# Patient Record
Sex: Female | Born: 1990 | Race: Black or African American | Hispanic: No | Marital: Single | Smoking: Current every day smoker
Health system: Southern US, Community
[De-identification: ages and names within clinical notes are randomized; demographics above are authoritative.]

## PROBLEM LIST (undated history)

## (undated) DIAGNOSIS — J45909 Unspecified asthma, uncomplicated: Secondary | ICD-10-CM

---
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
## 2013-08-24 ENCOUNTER — Emergency Department (HOSPITAL_COMMUNITY)
Admission: EM | Admit: 2013-08-24 | Discharge: 2013-08-25 | Disposition: A | Discharge status: Home / Self Care | Payer: 59 | Attending: Emergency Medicine | Admitting: Emergency Medicine

## 2013-08-24 DIAGNOSIS — T23269A Burn of second degree of back of unspecified hand, initial encounter: Secondary | ICD-10-CM | POA: Insufficient documentation

## 2013-08-24 DIAGNOSIS — X12XXXA Contact with other hot fluids, initial encounter: Secondary | ICD-10-CM | POA: Insufficient documentation

## 2013-08-24 DIAGNOSIS — Y93G9 Activity, other involving cooking and grilling: Secondary | ICD-10-CM | POA: Insufficient documentation

## 2013-08-24 DIAGNOSIS — T23262A Burn of second degree of back of left hand, initial encounter: Secondary | ICD-10-CM

## 2013-08-24 DIAGNOSIS — X131XXA Other contact with steam and other hot vapors, initial encounter: Secondary | ICD-10-CM

## 2013-08-24 DIAGNOSIS — Y9289 Other specified places as the place of occurrence of the external cause: Secondary | ICD-10-CM | POA: Insufficient documentation

## 2013-08-24 MED ORDER — SILVER SULFADIAZINE 1 % EX CREA
TOPICAL_CREAM | Freq: Once | CUTANEOUS | Status: AC
  Administered 2013-08-24: via TOPICAL
  Filled 2013-08-24: qty 85

## 2013-08-24 MED ORDER — OXYCODONE-ACETAMINOPHEN 5-325 MG PO TABS
2.0000 | ORAL_TABLET | Freq: Once | ORAL | Status: AC
  Administered 2013-08-24: 2 via ORAL
  Filled 2013-08-24: qty 2
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
## 2013-08-25 MED ORDER — SILVER SULFADIAZINE 1 % EX CREA: 1.0000 "application " | TOPICAL_CREAM | Freq: Every day | CUTANEOUS | Status: AC

## 2013-08-25 MED ORDER — OXYCODONE-ACETAMINOPHEN 5-325 MG PO TABS: 1.0000 | ORAL_TABLET | ORAL | Status: AC | PRN

## 2013-08-25 NOTE — Discharge Instructions (Signed)
Burn Care Your skin is a natural barrier to infection. It is the largest organ of your body. Burns damage this natural protection. To help prevent infection, it is very important to follow your caregiver's instructions in the care of your burn. Burns are classified as:  First degree. There is only redness of the skin (erythema). No scarring is expected.  Second degree. There is blistering of the skin. Scarring may occur with deeper burns.  Third degree. All layers of the skin are injured, and scarring is expected. HOME CARE INSTRUCTIONS   Wash your hands well before changing your bandage.  Change your bandage as often as directed by your caregiver.  Remove the old bandage. If the bandage sticks, you may soak it off with cool, clean water.  Cleanse the burn thoroughly but gently with mild soap and water.  Pat the area dry with a clean, dry cloth.  Apply a thin layer of antibacterial cream to the burn.  Apply a clean bandage as instructed by your caregiver.  Keep the bandage as clean and dry as possible.  Elevate the affected area for the first 24 hours, then as instructed by your caregiver.  Only take over-the-counter or prescription medicines for pain, discomfort, or fever as directed by your caregiver. SEEK IMMEDIATE MEDICAL CARE IF:   You develop excessive pain.  You develop redness, tenderness, swelling, or red streaks near the burn.  The burned area develops yellowish-white fluid (pus) or a bad smell.  You have a fever. MAKE SURE YOU:   Understand these instructions.  Will watch your condition.  Will get help right away if you are not doing well or get worse. Document Released: 01/22/2005 Document Revised: 04/16/2011 Document Reviewed: 06/14/2010 [employer] Patient Information 2015 [employer]. This information is not intended to replace advice given to you by your health care provider. Make sure you discuss any questions you have with your health care  provider.  
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
## 2014-04-23 ENCOUNTER — Encounter (HOSPITAL_COMMUNITY): Payer: Self-pay | Admitting: Emergency Medicine

## 2014-04-23 ENCOUNTER — Emergency Department (HOSPITAL_COMMUNITY)
Admission: EM | Admit: 2014-04-23 | Discharge: 2014-04-23 | Disposition: A | Discharge status: Home / Self Care | Payer: No Typology Code available for payment source | Attending: Emergency Medicine | Admitting: Emergency Medicine

## 2014-04-23 ENCOUNTER — Emergency Department (HOSPITAL_COMMUNITY): Payer: No Typology Code available for payment source

## 2014-04-23 DIAGNOSIS — Z79899 Other long term (current) drug therapy: Secondary | ICD-10-CM | POA: Diagnosis not present

## 2014-04-23 DIAGNOSIS — J45909 Unspecified asthma, uncomplicated: Secondary | ICD-10-CM | POA: Diagnosis not present

## 2014-04-23 DIAGNOSIS — Y9389 Activity, other specified: Secondary | ICD-10-CM | POA: Diagnosis not present

## 2014-04-23 DIAGNOSIS — S8002XA Contusion of left knee, initial encounter: Secondary | ICD-10-CM | POA: Insufficient documentation

## 2014-04-23 DIAGNOSIS — S6392XA Sprain of unspecified part of left wrist and hand, initial encounter: Secondary | ICD-10-CM | POA: Diagnosis not present

## 2014-04-23 DIAGNOSIS — Y92410 Unspecified street and highway as the place of occurrence of the external cause: Secondary | ICD-10-CM | POA: Insufficient documentation

## 2014-04-23 DIAGNOSIS — S8992XA Unspecified injury of left lower leg, initial encounter: Secondary | ICD-10-CM | POA: Diagnosis present

## 2014-04-23 DIAGNOSIS — Y998 Other external cause status: Secondary | ICD-10-CM | POA: Diagnosis not present

## 2014-04-23 HISTORY — DX: Unspecified asthma, uncomplicated: J45.909
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
## 2014-04-23 MED ORDER — CYCLOBENZAPRINE HCL 10 MG PO TABS: 10.0000 mg | ORAL_TABLET | Freq: Two times a day (BID) | ORAL | Status: AC | PRN

## 2014-04-23 MED ORDER — IBUPROFEN 800 MG PO TABS: 800.0000 mg | ORAL_TABLET | Freq: Three times a day (TID) | ORAL | Status: AC

## 2014-04-23 MED ORDER — HYDROCODONE-ACETAMINOPHEN 5-325 MG PO TABS: 1.0000 | ORAL_TABLET | Freq: Once | ORAL | Status: DC

## 2014-04-23 MED ORDER — HYDROCODONE-ACETAMINOPHEN 5-325 MG PO TABS: 1.0000 | ORAL_TABLET | ORAL | Status: AC | PRN

## 2014-04-23 MED ORDER — IBUPROFEN 800 MG PO TABS
800.0000 mg | ORAL_TABLET | Freq: Once | ORAL | Status: AC
  Administered 2014-04-23: 800 mg via ORAL
  Filled 2014-04-23: qty 1
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
## 2014-04-23 NOTE — Discharge Instructions (Signed)
Cryotherapy °Cryotherapy means treatment with cold. Ice or gel packs can be used to reduce both pain and swelling. Ice is the most helpful within the first 24 to 48 hours after an injury or flare-up from overusing a muscle or joint. Sprains, strains, spasms, burning pain, shooting pain, and aches can all be eased with ice. Ice can also be used when recovering from surgery. Ice is effective, has very few side effects, and is safe for most people to use. °PRECAUTIONS  °Ice is not a safe treatment option for people with: °· Raynaud phenomenon. This is a condition affecting small blood vessels in the extremities. Exposure to cold may cause your problems to return. °· Cold hypersensitivity. There are many forms of cold hypersensitivity, including: °· Cold urticaria. Red, itchy hives appear on the skin when the tissues begin to warm after being iced. °· Cold erythema. This is a red, itchy rash caused by exposure to cold. °· Cold hemoglobinuria. Red blood cells break down when the tissues begin to warm after being iced. The hemoglobin that carry oxygen are passed into the urine because they cannot combine with blood proteins fast enough. °· Numbness or altered sensitivity in the area being iced. °If you have any of the following conditions, do not use ice until you have discussed cryotherapy with your caregiver: °· Heart conditions, such as arrhythmia, angina, or chronic heart disease. °· High blood pressure. °· Healing wounds or open skin in the area being iced. °· Current infections. °· Rheumatoid arthritis. °· Poor circulation. °· Diabetes. °Ice slows the blood flow in the region it is applied. This is beneficial when trying to stop inflamed tissues from spreading irritating chemicals to surrounding tissues. However, if you expose your skin to cold temperatures for too long or without the proper protection, you can damage your skin or nerves. Watch for signs of skin damage due to cold. °HOME CARE INSTRUCTIONS °Follow  these tips to use ice and cold packs safely. °· Place a dry or damp towel between the ice and skin. A damp towel will cool the skin more quickly, so you may need to shorten the time that the ice is used. °· For a more rapid response, add gentle compression to the ice. °· Ice for no more than 10 to 20 minutes at a time. The bonier the area you are icing, the less time it will take to get the benefits of ice. °· Check your skin after 5 minutes to make sure there are no signs of a poor response to cold or skin damage. °· Rest 20 minutes or more between uses. °· Once your skin is numb, you can end your treatment. You can test numbness by very lightly touching your skin. The touch should be so light that you do not see the skin dimple from the pressure of your fingertip. When using ice, most people will feel these normal sensations in this order: cold, burning, aching, and numbness. °· Do not use ice on someone who cannot communicate their responses to pain, such as small children or people with dementia. °HOW TO MAKE AN ICE PACK °Ice packs are the most common way to use ice therapy. Other methods include ice massage, ice baths, and cryosprays. Muscle creams that cause a cold, tingly feeling do not offer the same benefits that ice offers and should not be used as a substitute unless recommended by your caregiver. °To make an ice pack, do one of the following: °· Place crushed ice or a   bag of frozen vegetables in a sealable plastic bag. Squeeze out the excess air. Place this bag inside another plastic bag. Slide the bag into a pillowcase or place a damp towel between your skin and the bag.  Mix 3 parts water with 1 part rubbing alcohol. Freeze the mixture in a sealable plastic bag. When you remove the mixture from the freezer, it will be slushy. Squeeze out the excess air. Place this bag inside another plastic bag. Slide the bag into a pillowcase or place a damp towel between your skin and the bag. SEEK MEDICAL CARE  IF:  You develop white spots on your skin. This may give the skin a blotchy (mottled) appearance.  Your skin turns blue or pale.  Your skin becomes waxy or hard.  Your swelling gets worse. MAKE SURE YOU:   Understand these instructions.  Will watch your condition.  Will get help right away if you are not doing well or get worse. Document Released: 09/18/2010 Document Revised: 06/08/2013 Document Reviewed: 09/18/2010 [facility] Patient Information 2015 [place], [place]. This information is not intended to replace advice given to you by your health care provider. Make sure you discuss any questions you have with your health care provider.  Contusion A contusion is a deep bruise. Contusions are the result of an injury that caused bleeding under the skin. The contusion may turn blue, purple, or yellow. Minor injuries will give you a painless contusion, but more severe contusions may stay painful and swollen for a few weeks.  CAUSES  A contusion is usually caused by a blow, trauma, or direct force to an area of the body. SYMPTOMS   Swelling and redness of the injured area.  Bruising of the injured area.  Tenderness and soreness of the injured area.  Pain. DIAGNOSIS  The diagnosis can be made by taking a history and physical exam. An X-ray, CT scan, or MRI may be needed to determine if there were any associated injuries, such as fractures. TREATMENT  Specific treatment will depend on what area of the body was injured. In general, the best treatment for a contusion is resting, icing, elevating, and applying cold compresses to the injured area. Over-the-counter medicines may also be recommended for pain control. Ask your caregiver what the best treatment is for your contusion. HOME CARE INSTRUCTIONS   Put ice on the injured area.  Put ice in a plastic bag.  Place a towel between your skin and the bag.  Leave the ice on for 15-20 minutes, 3-4 times a day, or as directed by your health  care provider.  Only take over-the-counter or prescription medicines for pain, discomfort, or fever as directed by your caregiver. Your caregiver may recommend avoiding anti-inflammatory medicines (aspirin, ibuprofen, and naproxen) for 48 hours because these medicines may increase bruising.  Rest the injured area.  If possible, elevate the injured area to reduce swelling. SEEK IMMEDIATE MEDICAL CARE IF:   You have increased bruising or swelling.  You have pain that is getting worse.  Your swelling or pain is not relieved with medicines. MAKE SURE YOU:   Understand these instructions.  Will watch your condition.  Will get help right away if you are not doing well or get worse. Document Released: 11/01/2004 Document Revised: 01/27/2013 Document Reviewed: 11/27/2010 [facility] Patient Information 2015 [place], [place]. This information is not intended to replace advice given to you by your health care provider. Make sure you discuss any questions you have with your health care provider. Joint  Sprain A sprain is a tear or stretch in the ligaments that hold a joint together. Severe sprains may need as long as 3-6 weeks of immobilization and/or exercises to heal completely. Sprained joints should be rested and protected. If not, they can become unstable and prone to re-injury. Proper treatment can reduce your pain, shorten the period of disability, and reduce the risk of repeated injuries. TREATMENT   Rest and elevate the injured joint to reduce pain and swelling.  Apply ice packs to the injury for 20-30 minutes every 2-3 hours for the next 2-3 days.  Keep the injury wrapped in a compression bandage or splint as long as the joint is painful or as instructed by your caregiver.  Do not use the injured joint until it is completely healed to prevent re-injury and chronic instability. Follow the instructions of your caregiver.  Long-term sprain management may require exercises and/or treatment  by a physical therapist. Taping or special braces may help stabilize the joint until it is completely better. SEEK MEDICAL CARE IF:   You develop increased pain or swelling of the joint.  You develop increasing redness and warmth of the joint.  You develop a fever.  It becomes stiff.  Your hand or foot gets cold or numb. Document Released: 03/01/2004 Document Revised: 04/16/2011 Document Reviewed: 02/09/2008 [facility] Patient Information 2015 [place], [place]. This information is not intended to replace advice given to you by your health care provider. Make sure you discuss any questions you have with your health care provider. Motor Vehicle Collision It is common to have multiple bruises and sore muscles after a motor vehicle collision (MVC). These tend to feel worse for the first 24 hours. You may have the most stiffness and soreness over the first several hours. You may also feel worse when you wake up the first morning after your collision. After this point, you will usually begin to improve with each day. The speed of improvement often depends on the severity of the collision, the number of injuries, and the location and nature of these injuries. HOME CARE INSTRUCTIONS  Put ice on the injured area.  Put ice in a plastic bag.  Place a towel between your skin and the bag.  Leave the ice on for 15-20 minutes, 3-4 times a day, or as directed by your health care provider.  Drink enough fluids to keep your urine clear or pale yellow. Do not drink alcohol.  Take a warm shower or bath once or twice a day. This will increase blood flow to sore muscles.  You may return to activities as directed by your caregiver. Be careful when lifting, as this may aggravate neck or back pain.  Only take over-the-counter or prescription medicines for pain, discomfort, or fever as directed by your caregiver. Do not use aspirin. This may increase bruising and bleeding. SEEK IMMEDIATE MEDICAL CARE IF:  You  have numbness, tingling, or weakness in the arms or legs.  You develop severe headaches not relieved with medicine.  You have severe neck pain, especially tenderness in the middle of the back of your neck.  You have changes in bowel or bladder control.  There is increasing pain in any area of the body.  You have shortness of breath, light-headedness, dizziness, or fainting.  You have chest pain.  You feel sick to your stomach (nauseous), throw up (vomit), or sweat.  You have increasing abdominal discomfort.  There is blood in your urine, stool, or vomit.  You have pain  in your shoulder (shoulder strap areas).  You feel your symptoms are getting worse. MAKE SURE YOU:  Understand these instructions.  Will watch your condition.  Will get help right away if you are not doing well or get worse. Document Released: 01/22/2005 Document Revised: 06/08/2013 Document Reviewed: 06/21/2010 [facility] Patient Information 2015 [place]. This information is not intended to replace advice given to you by your health care provider. Make sure you discuss any questions you have with your health care provider.

## 2015-03-05 ENCOUNTER — Emergency Department (HOSPITAL_COMMUNITY)
Admission: EM | Admit: 2015-03-05 | Discharge: 2015-03-05 | Disposition: A | Discharge status: Home / Self Care | Payer: No Typology Code available for payment source | Attending: Emergency Medicine | Admitting: Emergency Medicine

## 2015-03-05 ENCOUNTER — Encounter (HOSPITAL_COMMUNITY): Payer: Self-pay

## 2015-03-05 DIAGNOSIS — Z791 Long term (current) use of non-steroidal anti-inflammatories (NSAID): Secondary | ICD-10-CM | POA: Diagnosis not present

## 2015-03-05 DIAGNOSIS — J45909 Unspecified asthma, uncomplicated: Secondary | ICD-10-CM | POA: Insufficient documentation

## 2015-03-05 DIAGNOSIS — Y998 Other external cause status: Secondary | ICD-10-CM | POA: Diagnosis not present

## 2015-03-05 DIAGNOSIS — Z79899 Other long term (current) drug therapy: Secondary | ICD-10-CM | POA: Diagnosis not present

## 2015-03-05 DIAGNOSIS — F17210 Nicotine dependence, cigarettes, uncomplicated: Secondary | ICD-10-CM | POA: Insufficient documentation

## 2015-03-05 DIAGNOSIS — Y9389 Activity, other specified: Secondary | ICD-10-CM | POA: Diagnosis not present

## 2015-03-05 DIAGNOSIS — Z043 Encounter for examination and observation following other accident: Secondary | ICD-10-CM | POA: Insufficient documentation

## 2015-03-05 DIAGNOSIS — Y92410 Unspecified street and highway as the place of occurrence of the external cause: Secondary | ICD-10-CM | POA: Insufficient documentation

## 2015-03-05 MED ORDER — IBUPROFEN 400 MG PO TABS
600.0000 mg | ORAL_TABLET | Freq: Once | ORAL | Status: AC
  Administered 2015-03-05: 600 mg via ORAL
  Filled 2015-03-05: qty 1

## 2015-03-05 NOTE — ED Notes (Signed)
Ginger ale given
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
## 2015-03-05 NOTE — Discharge Instructions (Signed)
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
## 2022-11-01 IMAGING — MR MRI KNEE RT W/O CONTRAST
5 of 6 series · 37 of 40 positions shown · non-contrast
Comparison: none

﻿

The patient was scheduled for an MRI arthrogram.  However, she declined injection of contrast material into the knee.  A routine knee MRI was therefore performed.
TECHNIQUE: Proton density and T2-weighted sagittal images of the knee were taken.  Proton density coronal and axial images were also obtained.

[Series 3: PD fat-sat · axial · 3.0mm · 0.59mm/px · z∈[-87,+61]mm · 10 of 38 slices shown (1 of 3)]
[im 1/38]
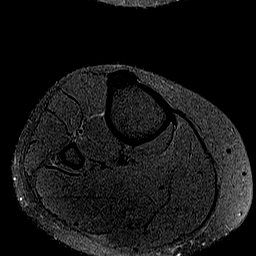
[im 5/38]
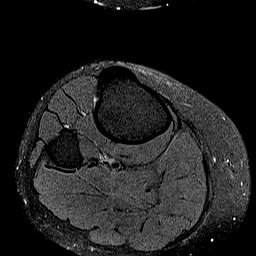
[im 9/38]
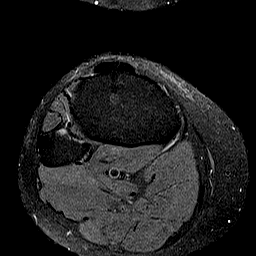
[im 13/38]
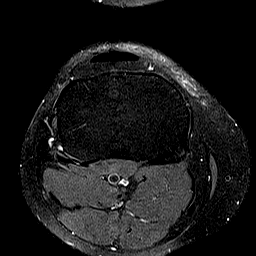
[im 17/38]
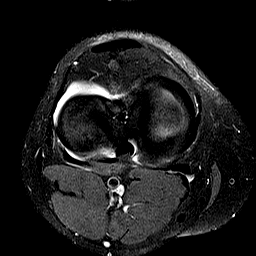
[im 21/38]
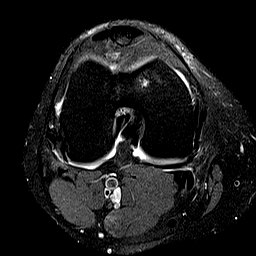
[im 25/38]
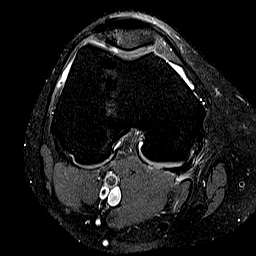
[im 29/38]
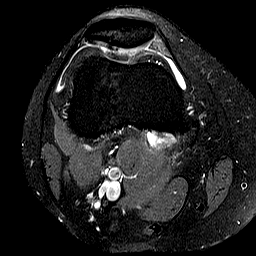
[im 33/38]
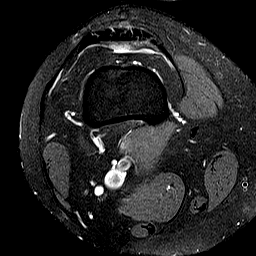
[im 38/38]
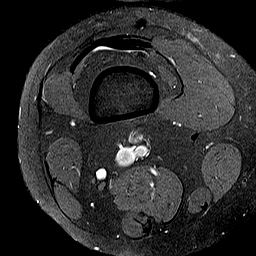

[Series 4: PD fat-sat · sagittal · 4.5mm · 0.47mm/px · 7 of 26 slices shown (2 of 3)]
[im 1/26]
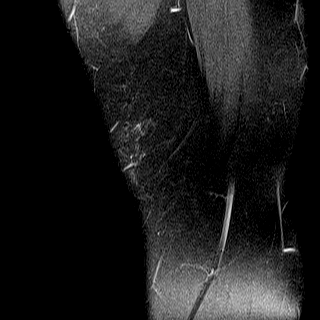
[im 5/26]
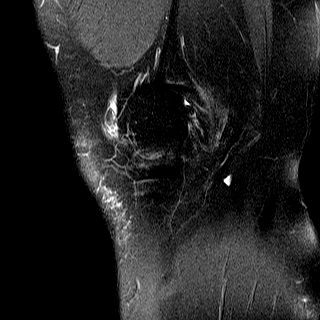
[im 9/26]
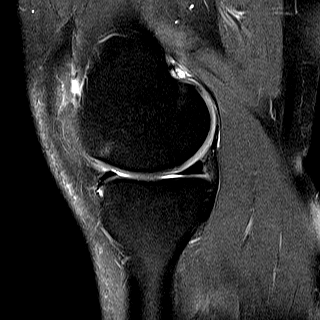
[im 13/26]
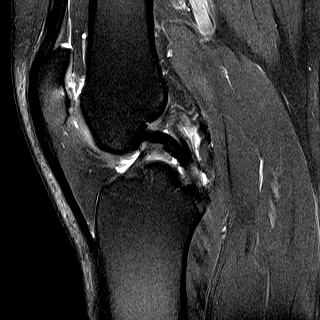
[im 17/26]
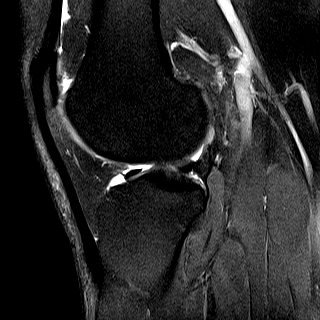
[im 21/26]
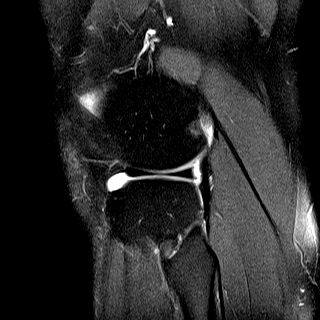
[im 26/26]
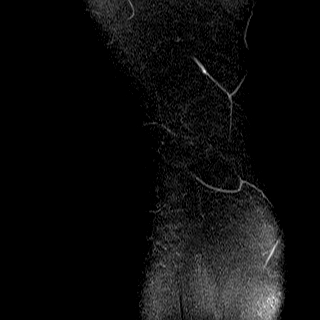

[Series 5: T2 · sagittal · 4.5mm · 0.59mm/px · 7 of 26 slices shown]
[im 1/26]
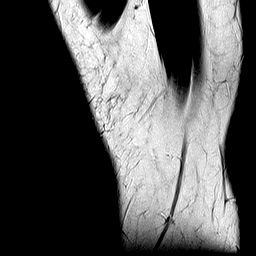
[im 5/26]
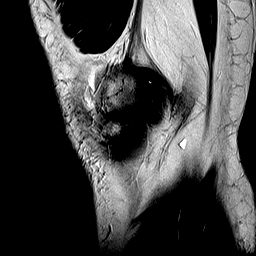
[im 9/26]
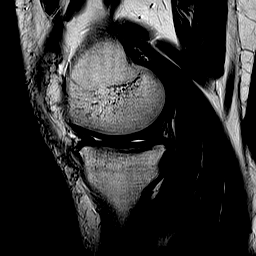
[im 13/26]
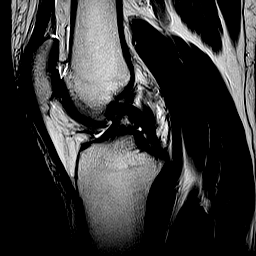
[im 17/26]
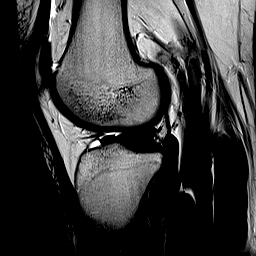
[im 21/26]
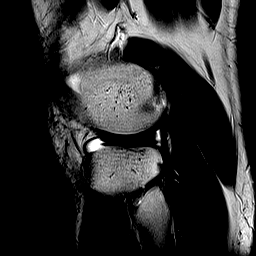
[im 26/26]
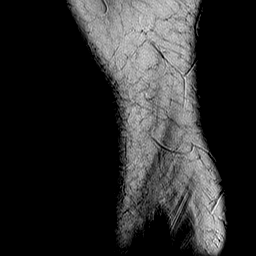

[Series 6: PD · sagittal · 4.5mm · 0.59mm/px · 7 of 26 slices shown]
[im 1/26]
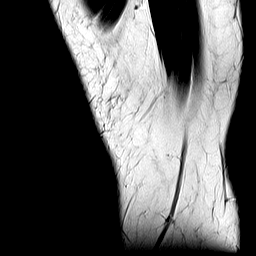
[im 5/26]
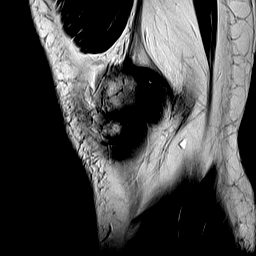
[im 9/26]
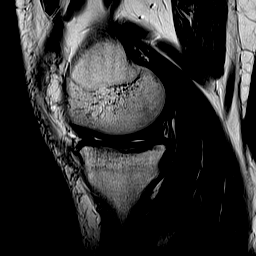
[im 13/26]
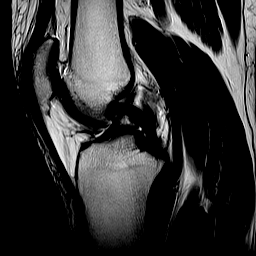
[im 17/26]
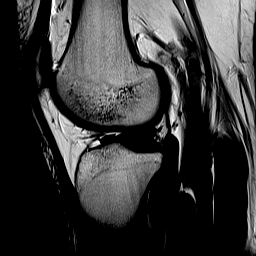
[im 21/26]
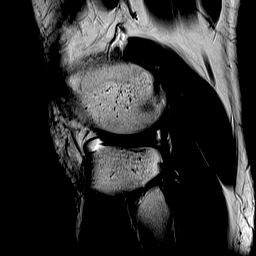
[im 26/26]
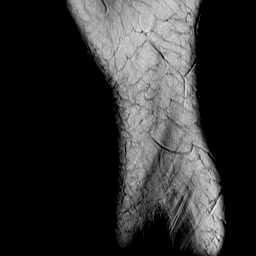

[Series 7: PD fat-sat · coronal · 4.5mm · 0.50mm/px · 6 of 24 slices shown (3 of 3)]
[im 1/24]
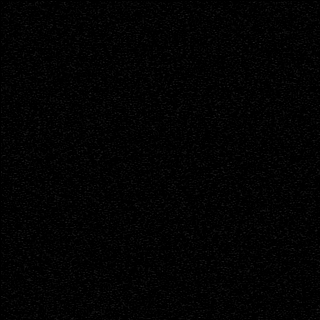
[im 5/24]
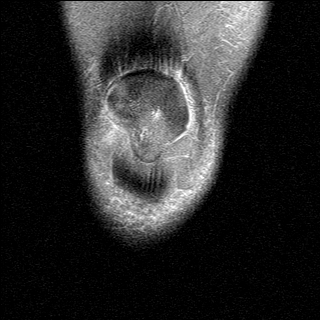
[im 10/24]
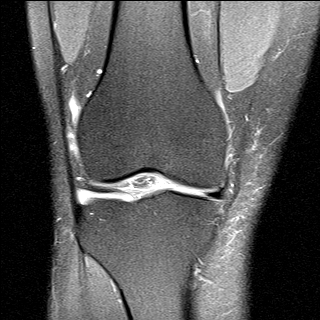
[im 14/24]
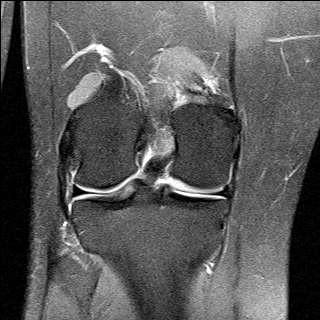
[im 19/24]
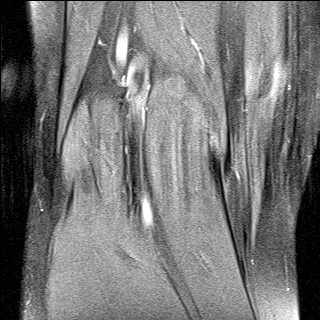
[im 24/24]
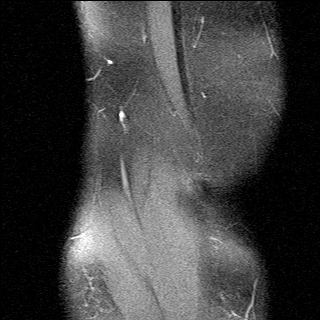

[37 of 40 positions shown; findings below may reference images not displayed]

FINDINGS: Alignment is normal.  

There is minimal joint fluid with no effusion.  

No meniscal tear, medially or laterally, identified.  

Anterior and posterior cruciate ligaments are normal.  

Collateral ligaments are normal.  

There are small subchondral cysts in the medial femoral condyle anteriorly at the patellofemoral joint.  There is slightly abnormal signal within articular cartilage overlying this consistent with chondrosis.  No chondral defect identified.  

There is a small subchondral cyst within the patella.  No chondral defect on the patella can be identified.  

The distal quadriceps tendon and the patellar tendon appear normal.  

No muscle or tendon tear in the popliteal space identified.  

Continued page 2…
IMPRESSION: 1. No acute findings identified.  

2. Early degenerative changes at the patellofemoral joint.  Focus of chondrosis on the distal femur with underlying cystic changes.  A small subchondral cyst in the patella.  

3. No other abnormality identified.
